# Patient Record
Sex: Male | Born: 2005 | Race: White | Hispanic: No | Marital: Single | Smoking: Current every day smoker
Health system: Southern US, Community
[De-identification: ages and names within clinical notes are randomized; demographics above are authoritative.]

---
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
## 2023-07-11 ENCOUNTER — Ambulatory Visit (INDEPENDENT_AMBULATORY_CARE_PROVIDER_SITE_OTHER): Payer: BC Managed Care – PPO | Admitting: Podiatry

## 2023-07-11 ENCOUNTER — Encounter: Payer: Self-pay | Admitting: Podiatry

## 2023-07-11 DIAGNOSIS — L600 Ingrowing nail: Secondary | ICD-10-CM | POA: Diagnosis not present

## 2023-07-11 NOTE — Progress Notes (Signed)
  Subjective:  Patient ID: Juan White, male    DOB: [birth year],   MRN: [id number]  Chief Complaint  Patient presents with   Nail Problem    Pt present today with a left great toe ingrown that he had for sometime now, when it started it was red and swollen.    17 y.o. male presents for concern of right ingrown toenail that has been present for a couple months. It initially had been infected but was doing some epsom salts and did improve. Relates it is still sore but getting better and curious about treatments at this point . Denies any other pedal complaints. Denies n/v/f/c.   No past medical history on file.  Objective:  Physical Exam: Vascular: DP/PT pulses 2/4 bilateral. CFT <3 seconds. Normal hair growth on digits. No edema.  Skin. No lacerations or abrasions bilateral feet. Incurvation of medial border of right great toenail. No erythema edema or purulence noted. Mild tenderness to palpation.  Musculoskeletal: MMT 5/5 bilateral lower extremities in DF, PF, Inversion and Eversion. Deceased ROM in DF of ankle joint.  Neurological: Sensation intact to light touch.   Assessment:   1. Ingrown right greater toenail      Plan:  Patient was evaluated and treated and all questions answered. Discussed ingrown toenails etiology and treatment options including procedure for removal vs conservative care.  Patient elects to proceed with conservative care at this time and will try soaking and dental floss to the nail.  Discussed if any changes or worsening he will return in the future for procedure. Did go over in detail Return as needed.    Louann Sjogren, DPM
